# Patient Record
Sex: Male | Born: 1940 | Race: White | Hispanic: No
Health system: Southern US, Community
[De-identification: ages and names within clinical notes are randomized; demographics above are authoritative.]

---

## 2011-03-08 ENCOUNTER — Emergency Department: Payer: Self-pay | Admitting: Emergency Medicine

## 2011-03-20 ENCOUNTER — Emergency Department: Payer: Self-pay | Admitting: *Deleted

## 2011-03-21 ENCOUNTER — Emergency Department: Payer: Self-pay | Admitting: Emergency Medicine

## 2011-05-20 ENCOUNTER — Ambulatory Visit: Payer: Self-pay | Admitting: Internal Medicine

## 2011-06-02 ENCOUNTER — Inpatient Hospital Stay: Payer: Self-pay | Admitting: Internal Medicine

## 2011-06-02 LAB — COMPREHENSIVE METABOLIC PANEL
Albumin: 3 g/dL — ABNORMAL LOW (ref 3.4–5.0)
Anion Gap: 14 (ref 7–16)
BUN: 43 mg/dL — ABNORMAL HIGH (ref 7–18)
Bilirubin,Total: 0.3 mg/dL (ref 0.2–1.0)
Chloride: 109 mmol/L — ABNORMAL HIGH (ref 98–107)
Co2: 29 mmol/L (ref 21–32)
EGFR (African American): >60
Glucose: 229 mg/dL — ABNORMAL HIGH (ref 65–99)
Osmolality: 320 (ref 275–301)
Potassium: 3.6 mmol/L (ref 3.5–5.1)
SGOT(AST): 36 U/L (ref 15–37)
SGPT (ALT): 46 U/L
Sodium: 152 mmol/L — ABNORMAL HIGH (ref 136–145)
Total Protein: 8.5 g/dL — ABNORMAL HIGH (ref 6.4–8.2)

## 2011-06-02 LAB — CBC
HCT: 44.1 % (ref 40.0–52.0)
HGB: 14.4 g/dL (ref 13.0–18.0)
MCH: 30.3 pg (ref 26.0–34.0)
MCV: 93 fL (ref 80–100)
Platelet: 243 10*3/uL (ref 150–440)
RBC: 4.74 10*6/uL (ref 4.40–5.90)

## 2011-06-02 LAB — URINALYSIS, COMPLETE
Glucose,UR: NEGATIVE mg/dL (ref 0–75)
Hyaline Cast: 6
Nitrite: NEGATIVE
Ph: 5 (ref 4.5–8.0)
Protein: 100
Specific Gravity: 1.033 (ref 1.003–1.030)
WBC UR: 199 /HPF (ref 0–5)

## 2011-06-02 LAB — TROPONIN I: Troponin-I: <0.02 ng/mL

## 2011-06-03 LAB — CBC WITH DIFFERENTIAL/PLATELET
Basophil #: 0 10*3/uL (ref 0.0–0.1)
Basophil %: 0.2 %
Eosinophil %: 0.8 %
HCT: 38.7 % — ABNORMAL LOW (ref 40.0–52.0)
HGB: 12.7 g/dL — ABNORMAL LOW (ref 13.0–18.0)
Lymphocyte #: 1.2 10*3/uL (ref 1.0–3.6)
Lymphocyte %: 11 %
MCH: 30.4 pg (ref 26.0–34.0)
MCHC: 32.8 g/dL (ref 32.0–36.0)
Monocyte %: 7.6 %
Neutrophil #: 8.8 10*3/uL — ABNORMAL HIGH (ref 1.4–6.5)
Neutrophil %: 80.4 %
Platelet: 211 10*3/uL (ref 150–440)
RBC: 4.18 10*6/uL — ABNORMAL LOW (ref 4.40–5.90)
WBC: 10.9 10*3/uL — ABNORMAL HIGH (ref 3.8–10.6)

## 2011-06-03 LAB — BASIC METABOLIC PANEL
BUN: 38 mg/dL — ABNORMAL HIGH (ref 7–18)
Calcium, Total: 8.9 mg/dL (ref 8.5–10.1)
Chloride: 110 mmol/L — ABNORMAL HIGH (ref 98–107)
Chloride: 110 mmol/L — ABNORMAL HIGH (ref 98–107)
Co2: 30 mmol/L (ref 21–32)
Co2: 32 mmol/L (ref 21–32)
Creatinine: 1.06 mg/dL (ref 0.60–1.30)
EGFR (Non-African Amer.): >60
Osmolality: 306 (ref 275–301)
Potassium: 3.3 mmol/L — ABNORMAL LOW (ref 3.5–5.1)
Potassium: 4 mmol/L (ref 3.5–5.1)
Sodium: 150 mmol/L — ABNORMAL HIGH (ref 136–145)

## 2011-06-04 LAB — BASIC METABOLIC PANEL
Anion Gap: 11 (ref 7–16)
BUN: 34 mg/dL — ABNORMAL HIGH (ref 7–18)
EGFR (African American): >60
EGFR (Non-African Amer.): >60
Osmolality: 308 (ref 275–301)

## 2011-06-04 LAB — CBC WITH DIFFERENTIAL/PLATELET
Basophil #: 0 10*3/uL (ref 0.0–0.1)
Eosinophil #: 0.2 10*3/uL (ref 0.0–0.7)
HCT: 37.8 % — ABNORMAL LOW (ref 40.0–52.0)
Lymphocyte #: 1.2 10*3/uL (ref 1.0–3.6)
MCH: 30.6 pg (ref 26.0–34.0)
MCHC: 32.8 g/dL (ref 32.0–36.0)
MCV: 93 fL (ref 80–100)
Monocyte #: 0.7 10*3/uL (ref 0.0–0.7)
Neutrophil #: 12 10*3/uL — ABNORMAL HIGH (ref 1.4–6.5)
Neutrophil %: 85.1 %
RDW: 13.4 % (ref 11.5–14.5)

## 2011-06-04 LAB — APTT: Activated PTT: 32.9 secs (ref 23.6–35.9)

## 2011-06-05 LAB — PROTIME-INR
INR: 1
Prothrombin Time: 13.5 secs (ref 11.5–14.7)

## 2011-06-05 LAB — BASIC METABOLIC PANEL
Anion Gap: 10 (ref 7–16)
BUN: 26 mg/dL — ABNORMAL HIGH (ref 7–18)
Co2: 31 mmol/L (ref 21–32)
Creatinine: 0.75 mg/dL (ref 0.60–1.30)
EGFR (Non-African Amer.): >60
Glucose: 120 mg/dL — ABNORMAL HIGH (ref 65–99)
Osmolality: 297 (ref 275–301)
Potassium: 3.1 mmol/L — ABNORMAL LOW (ref 3.5–5.1)
Sodium: 146 mmol/L — ABNORMAL HIGH (ref 136–145)

## 2011-06-05 LAB — APTT
Activated PTT: 56.5 secs — ABNORMAL HIGH (ref 23.6–35.9)
Activated PTT: 46.6 secs — ABNORMAL HIGH (ref 23.6–35.9)
Activated PTT: 71 secs — ABNORMAL HIGH (ref 23.6–35.9)

## 2011-06-05 LAB — CBC WITH DIFFERENTIAL/PLATELET
Basophil %: 0.4 %
Eosinophil #: 0.3 10*3/uL (ref 0.0–0.7)
Eosinophil %: 3.4 %
HGB: 11.1 g/dL — ABNORMAL LOW (ref 13.0–18.0)
Lymphocyte #: 1.2 10*3/uL (ref 1.0–3.6)
MCH: 30.4 pg (ref 26.0–34.0)
MCV: 94 fL (ref 80–100)
Monocyte #: 0.6 10*3/uL (ref 0.0–0.7)
Monocyte %: 5.8 %
Neutrophil #: 7.9 10*3/uL — ABNORMAL HIGH (ref 1.4–6.5)
RBC: 3.66 10*6/uL — ABNORMAL LOW (ref 4.40–5.90)

## 2011-06-05 LAB — MAGNESIUM: Magnesium: 2.1 mg/dL

## 2011-06-05 LAB — POTASSIUM: Potassium: 3.5 mmol/L (ref 3.5–5.1)

## 2011-06-05 LAB — URINE CULTURE

## 2011-06-06 LAB — BASIC METABOLIC PANEL
Anion Gap: 10 (ref 7–16)
BUN: 22 mg/dL — ABNORMAL HIGH (ref 7–18)
Calcium, Total: 8.8 mg/dL (ref 8.5–10.1)
Chloride: 104 mmol/L (ref 98–107)
EGFR (African American): >60
EGFR (Non-African Amer.): >60
Glucose: 117 mg/dL — ABNORMAL HIGH (ref 65–99)
Osmolality: 293 (ref 275–301)

## 2011-06-06 LAB — APTT: Activated PTT: 78.8 secs — ABNORMAL HIGH (ref 23.6–35.9)

## 2011-06-08 LAB — CULTURE, BLOOD (SINGLE)

## 2011-06-20 ENCOUNTER — Ambulatory Visit: Payer: Self-pay | Admitting: Internal Medicine

## 2011-06-20 DEATH — deceased

## 2012-11-15 IMAGING — CR DG CHEST 2V
1 series · 4 of 4 positions shown · non-contrast
Comparison: none

REASON FOR EXAM: cut off base of right and tip of left lung   please redo
 AMS
COMMENTS:

[Series 1: view not recorded · 0.17mm/px · 4 of 4 slices shown]
[im 1/4]
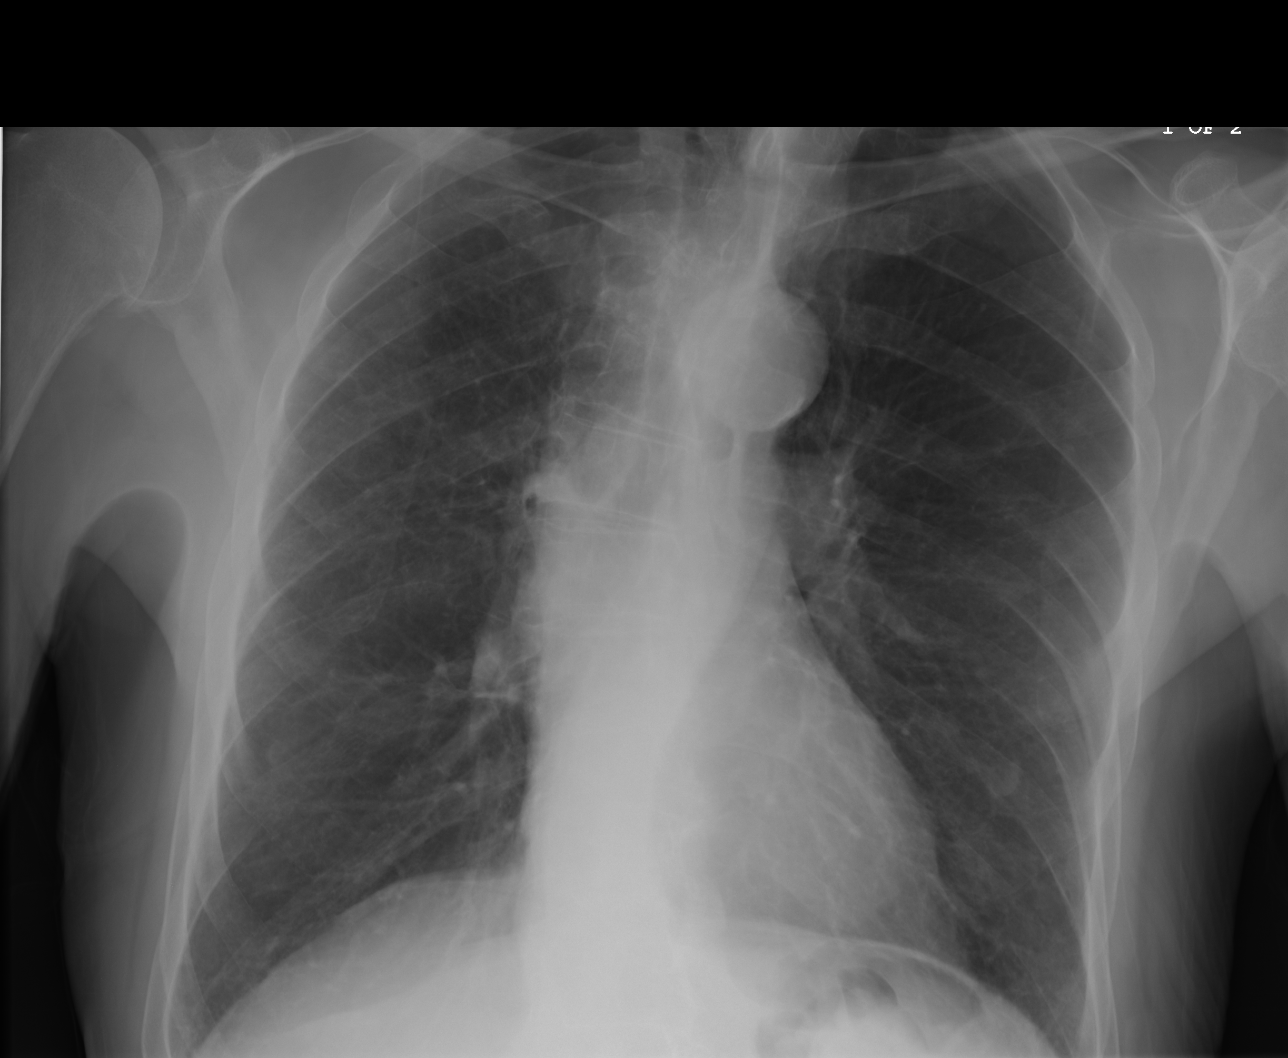
[im 2/4]
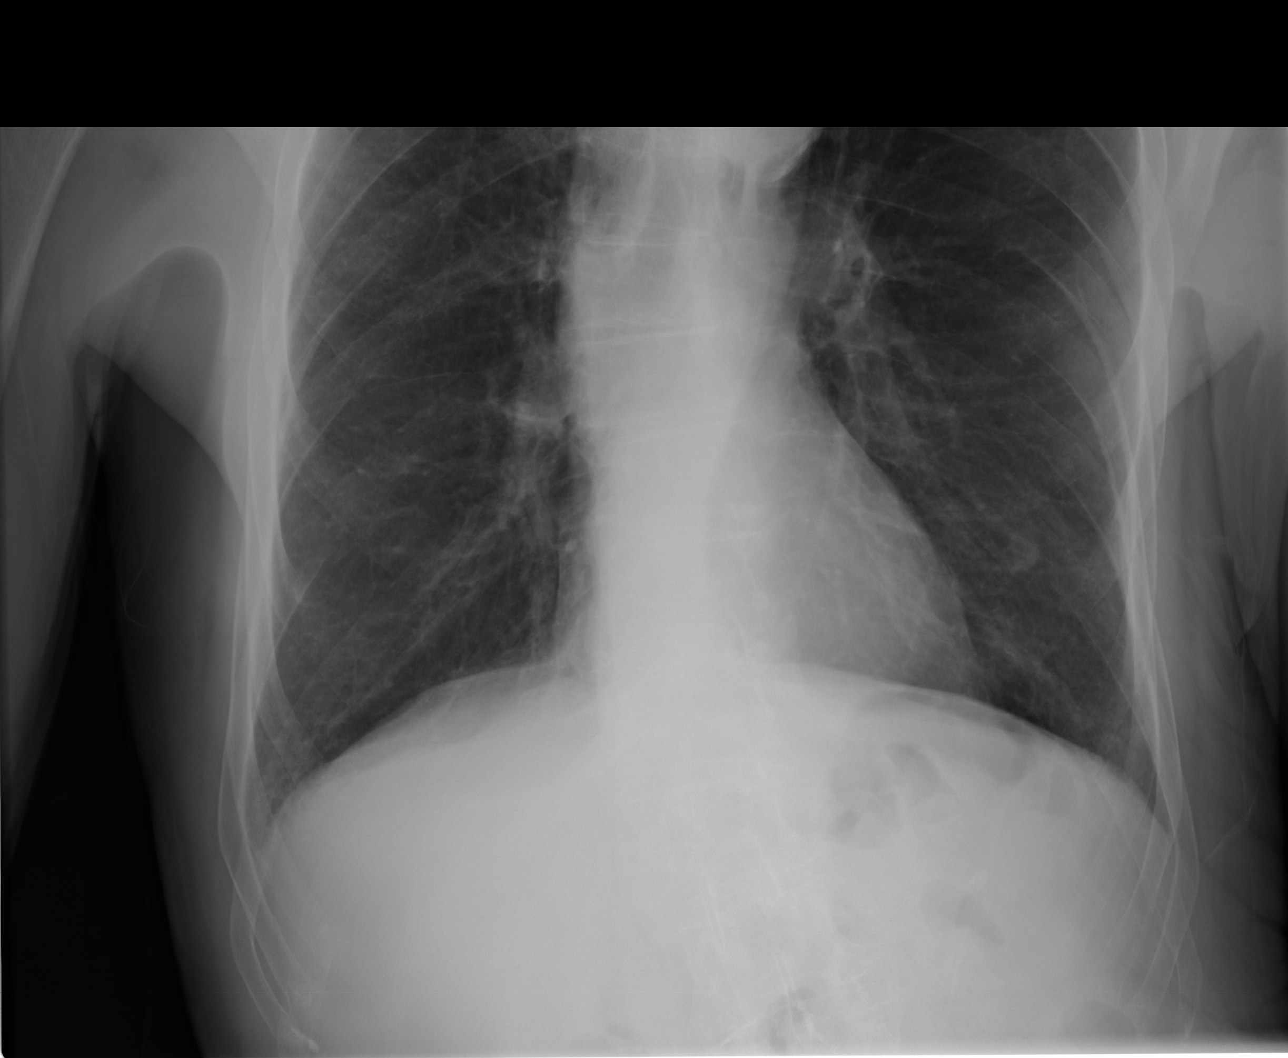
[im 3/4]
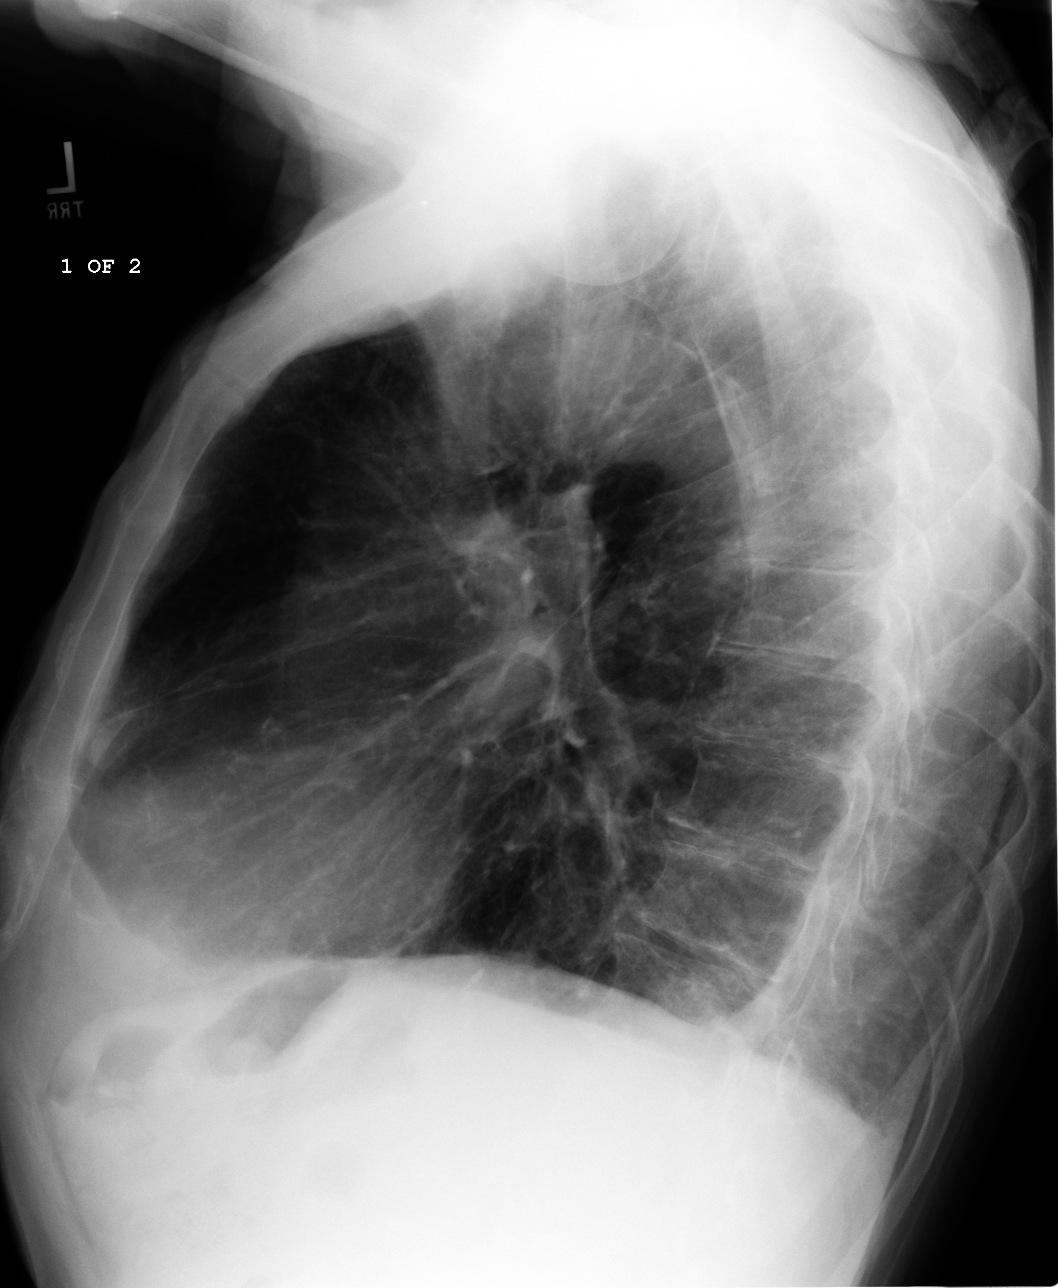
[im 4/4]
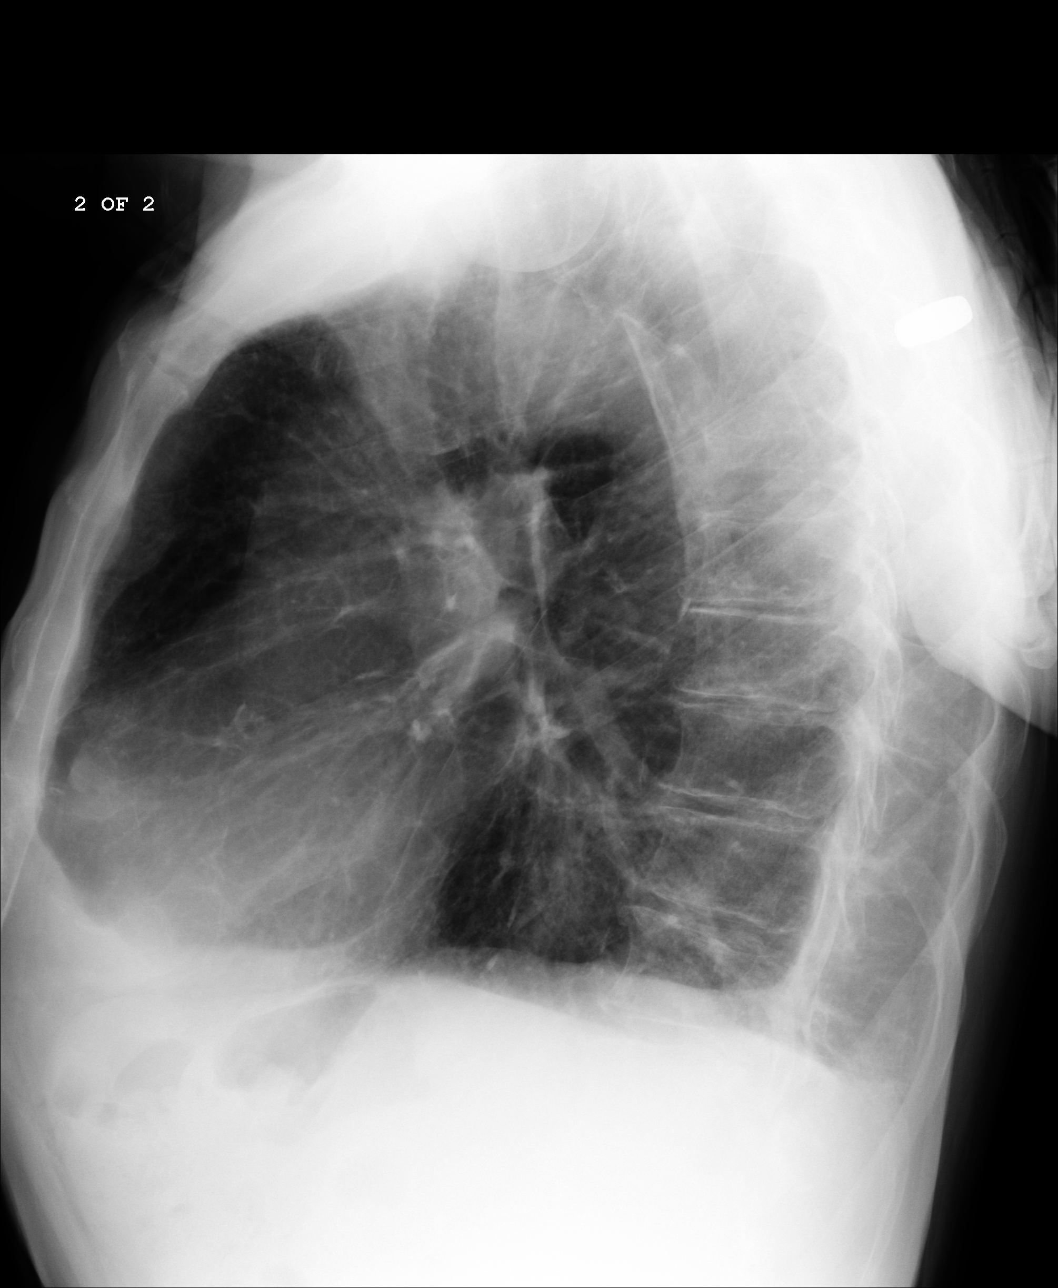

[4 of 4 positions shown; findings below may reference images not displayed]

PROCEDURE:     DXR - DXR CHEST PA (OR AP) AND LATERAL  - March 21, 2011  [DATE]

RESULT:     Comparison is made to a prior study dated same date earlier time.

There is flattening of the hemidiaphragms and increased AP diameter of the
chest. The lungs are hyperinflated. No focal regions of consolidation or
focal infiltrates appreciated. The cardiac silhouette and visualized bony
skeleton are unremarkable.
IMPRESSION: COPD without evidence of acute cardiopulmonary disease.

## 2012-11-15 IMAGING — CR DG CHEST 1V PORT
1 series · 1 of 1 positions shown · non-contrast
Comparison: none

REASON FOR EXAM: Chest Pain
COMMENTS:

PROCEDURE:     DXR - DXR PORTABLE CHEST SINGLE VIEW  - March 21, 2011  [DATE]
RESULT:     The lungs are hyperinflated. No focal regions of consolidation
or focal infiltrates appreciated. The cardiac silhouette is within normal
limits. The visualized bony skeleton is unremarkable.

[view not recorded]
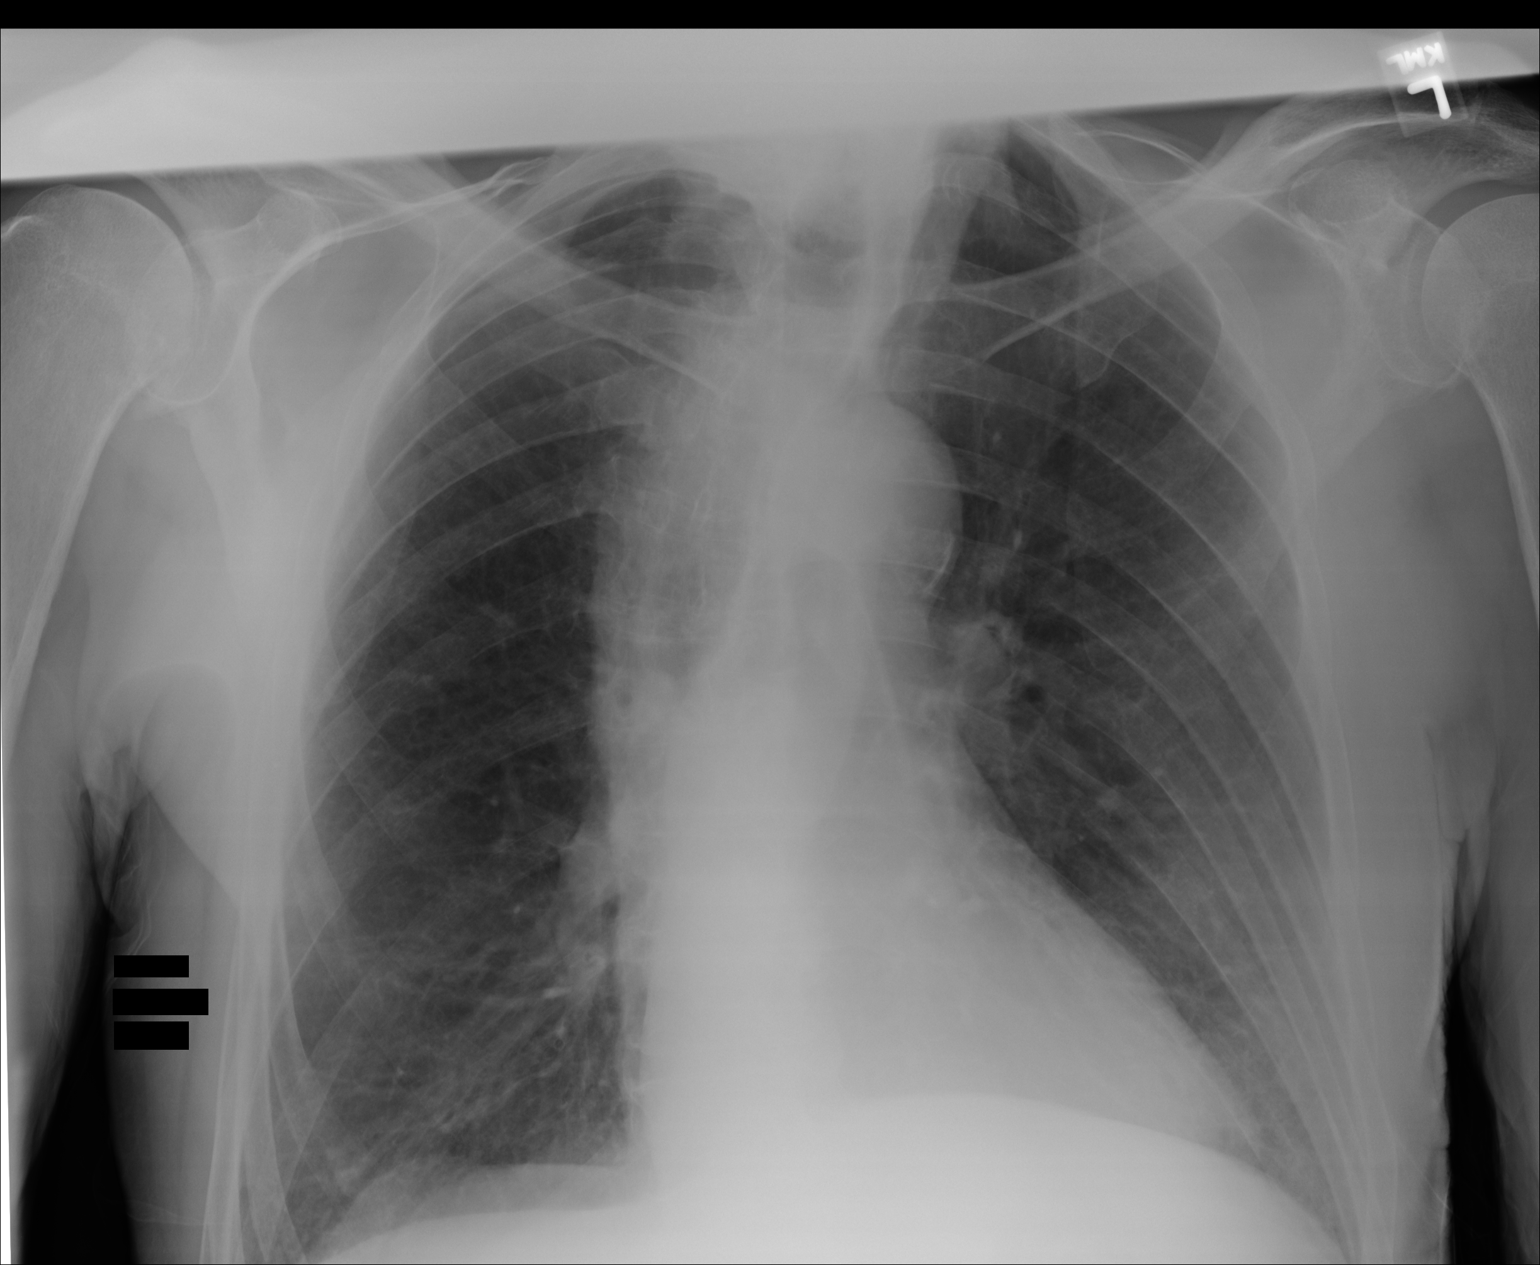

[1 of 1 positions shown; findings below may reference images not displayed]

IMPRESSION: COPD without evidence of acute cardiopulmonary disease.

## 2013-01-27 IMAGING — CR DG CHEST 2V
1 series · 4 of 4 positions shown · non-contrast
Comparison: none

REASON FOR EXAM: fever
COMMENTS:   May transport without cardiac monitor

[Series 1: ap · 0.17mm/px · 4 of 4 slices shown]
[im 1/4]
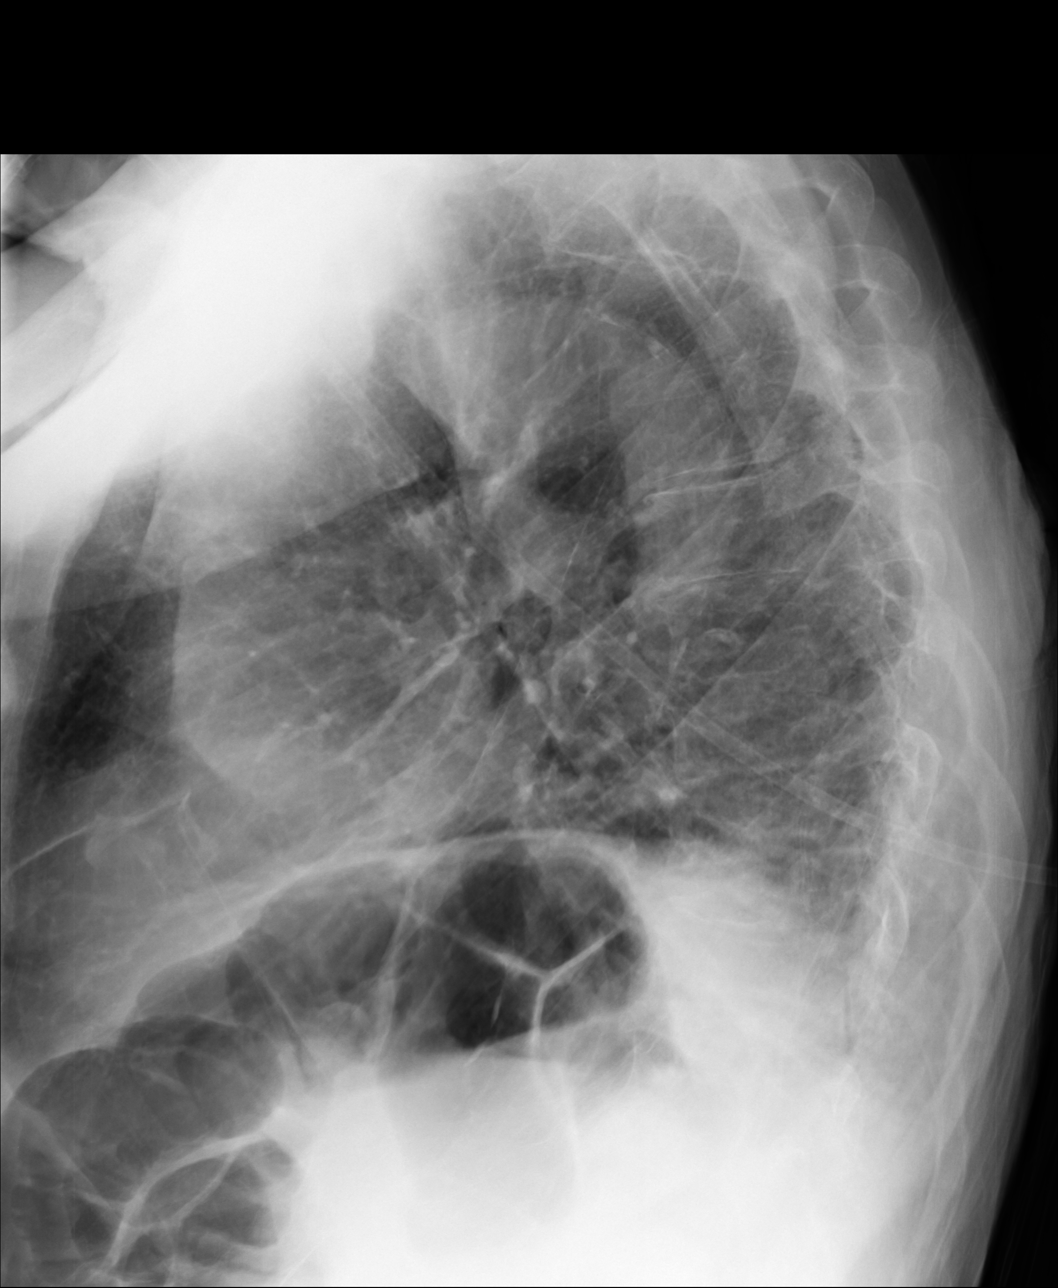
[im 2/4]
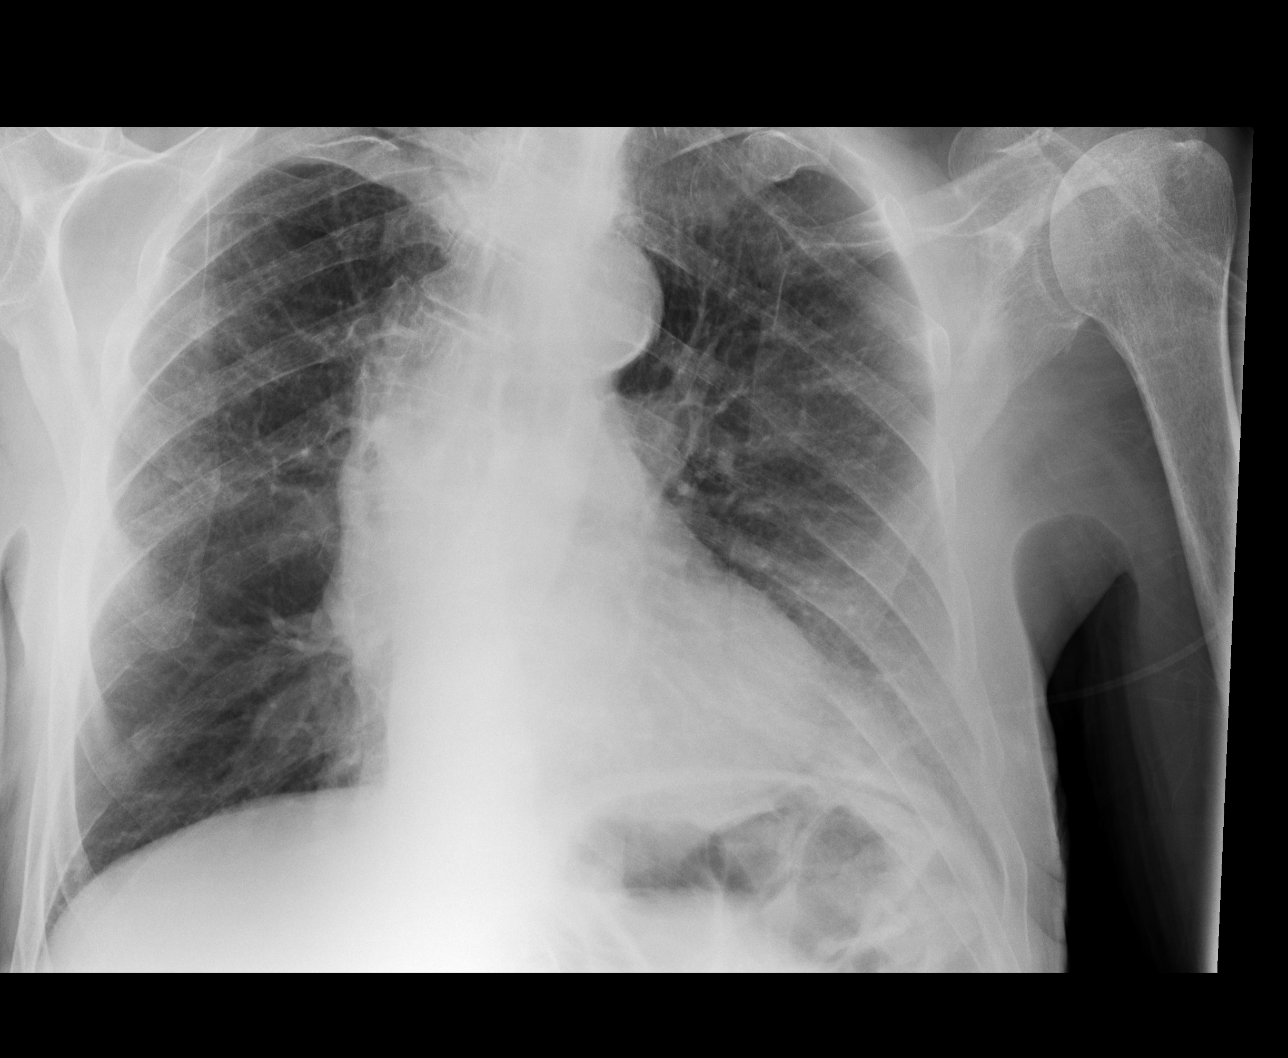
[im 3/4]
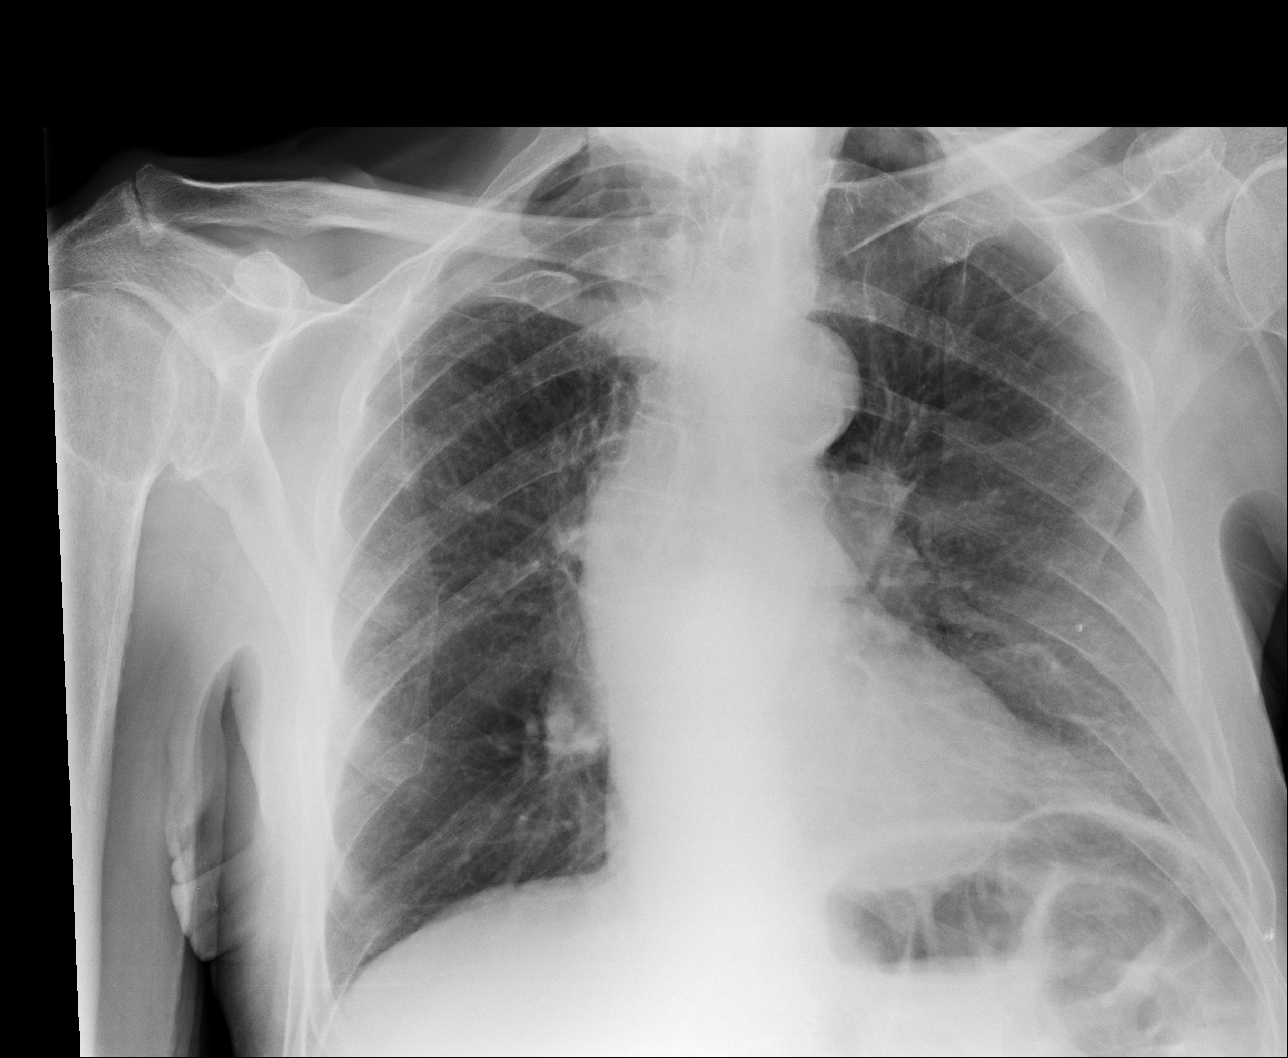
[im 4/4]
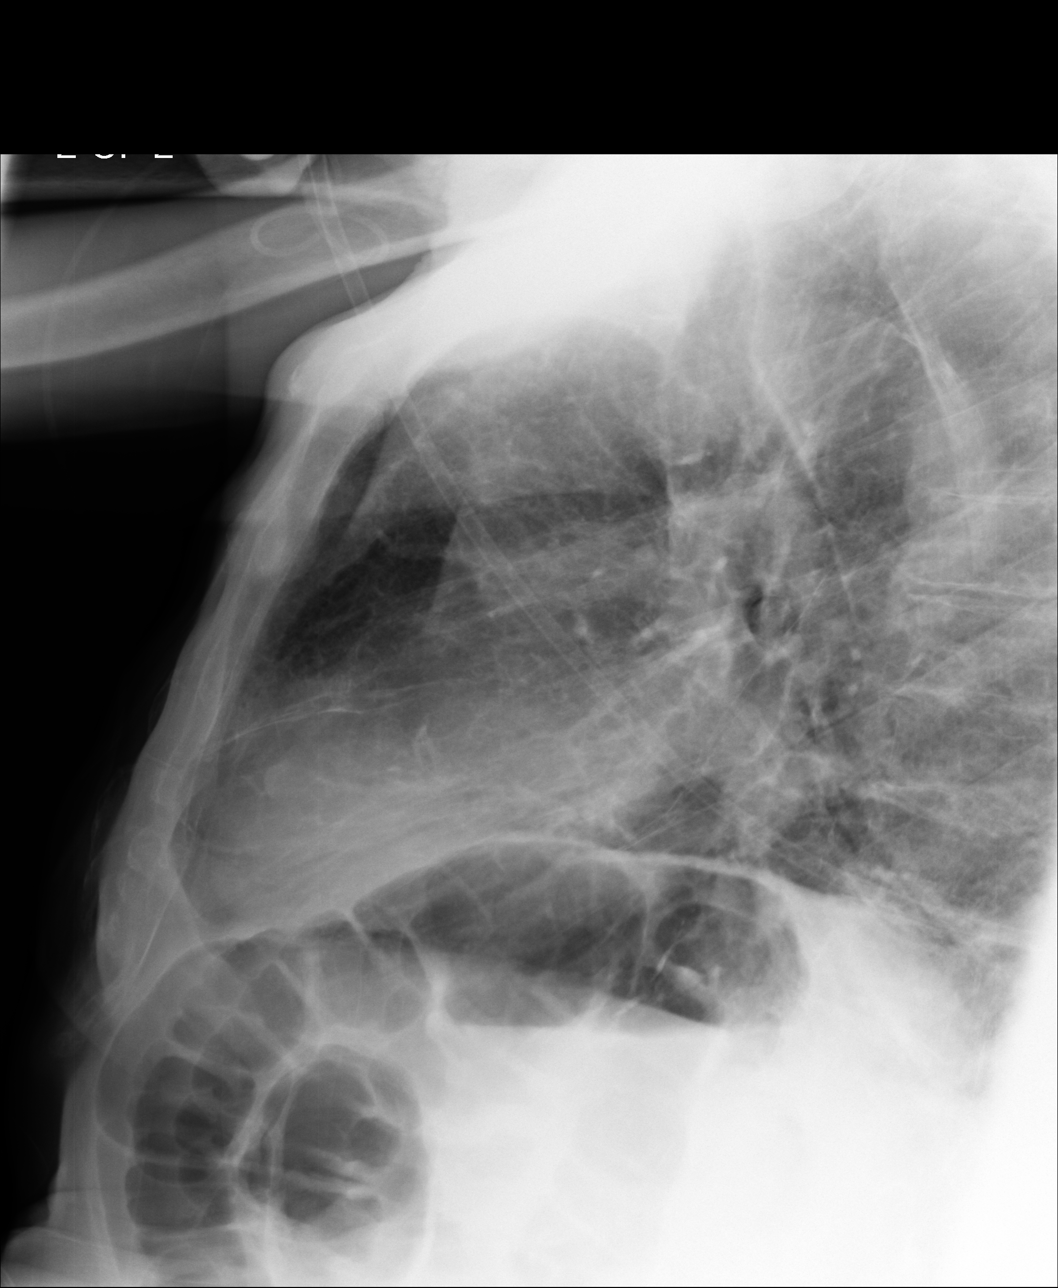

[4 of 4 positions shown; findings below may reference images not displayed]

PROCEDURE:     DXR - DXR CHEST PA (OR AP) AND LATERAL  - June 02, 2011  [DATE]

RESULT:     Comparison is made to the study dated 21 March, 2011.

There is increased density at the left lung base compared to the previous
study concerning for left lung base atelectasis versus developing
infiltrate. Trace left pleural effusion appears present. The cardiac
silhouette is normal. Curvilinear radiopacity projects just below the right
clavicle over the right upper lobe and may represent artifact. There is no
edema or pneumothorax. Atherosclerotic calcification is present within the
aortic arch. There is a mild thoracic scoliosis concave to the left.
IMPRESSION: 1. Left lung base developing infiltrate versus atelectasis with a trace left
pleural effusion. Emphysematous lung disease present.

## 2013-01-29 IMAGING — CT CT CHEST-ABD-PELV W/ CM
1 of 3 series · 11 of 32 positions shown, 17 images · non-contrast
Comparison: None

REASON FOR EXAM: (1) pneumonia; (2) pain on palpation, elevated WBC
count, worsening
COMMENTS:

PROCEDURE:     CT  - CT CHEST ABDOMEN AND PELVIS W  - June 04, 2011  [DATE]
RESULT:     CT CHEST, ABDOMEN, AND PELVIS
HISTORY: Pneumonia
TECHNIQUE: Multiple axial images obtained from the thoracic inlet to the
pubic symphysis, without p.o. contrast and with 100 ml of Fsovue-SCS
intravenous contrast.

[Series 2: soft tissue · axial · 0.84mm/px · z∈[-606,-11]mm · 11 of 143 slices shown, 17 images]
[im 12/143  soft-tissue]
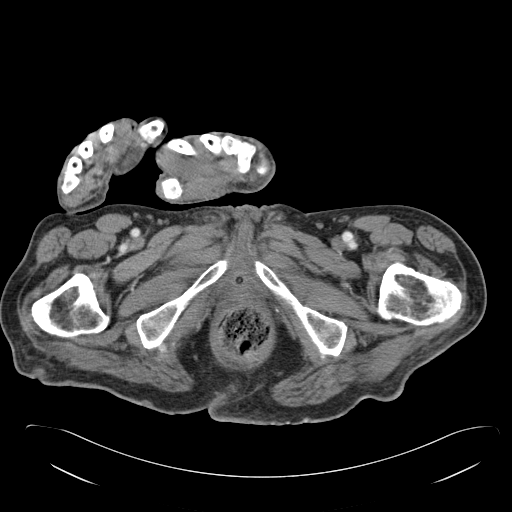
[im 12/143  bone]
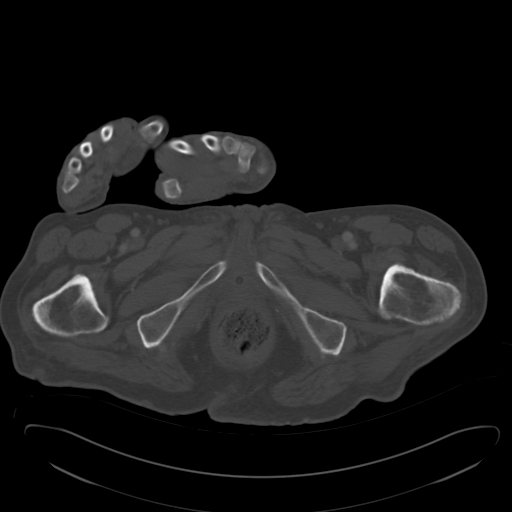
[im 24/143  soft-tissue]
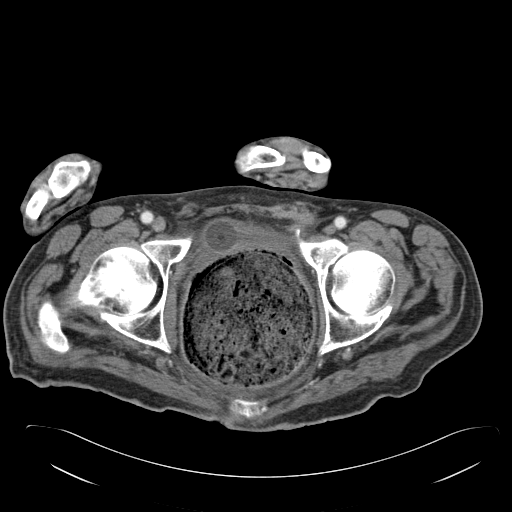
[im 36/143  soft-tissue]
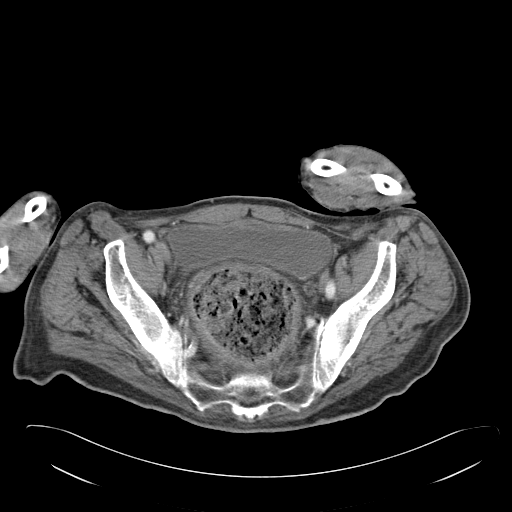
[im 48/143  soft-tissue]
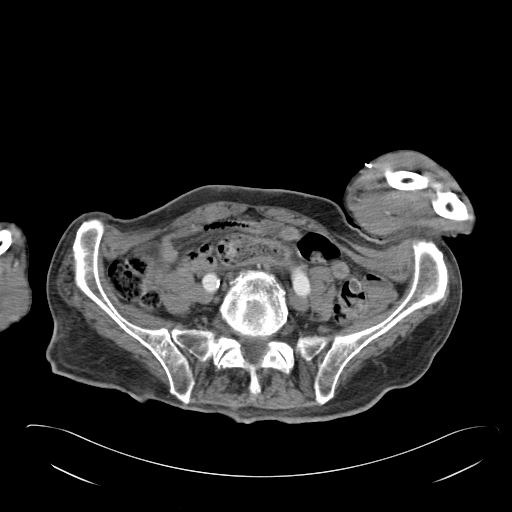
[im 60/143  soft-tissue]
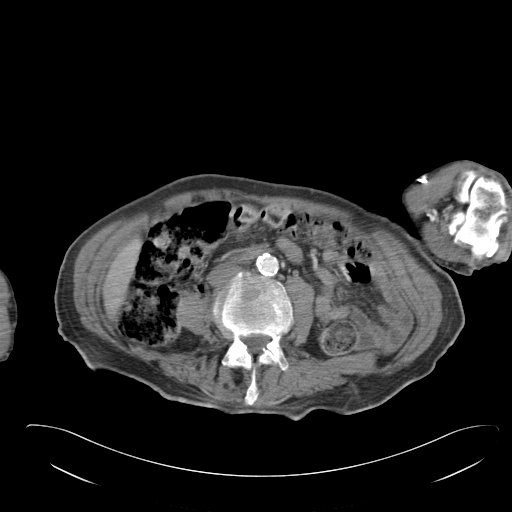
[im 72/143  soft-tissue]
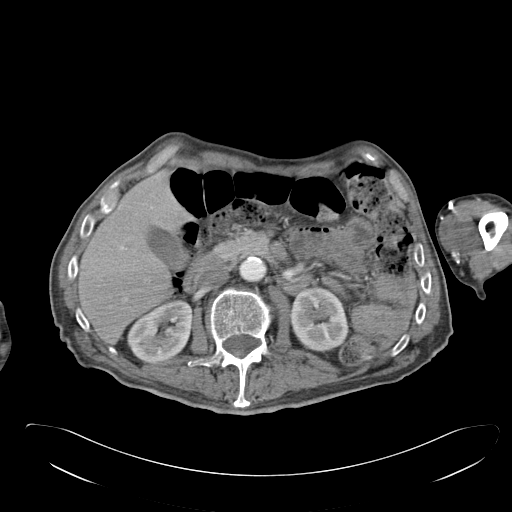
[im 83/143  soft-tissue]
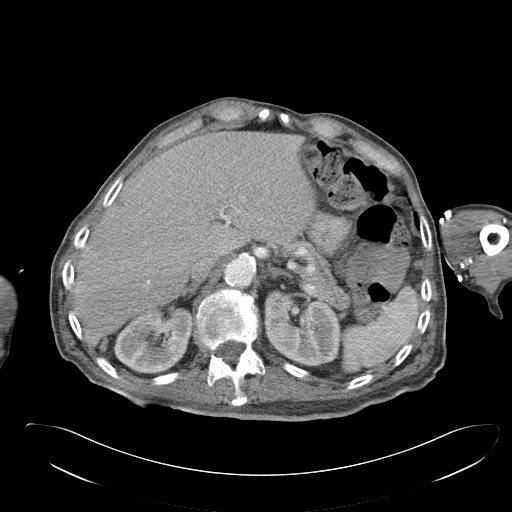
[im 95/143  soft-tissue]
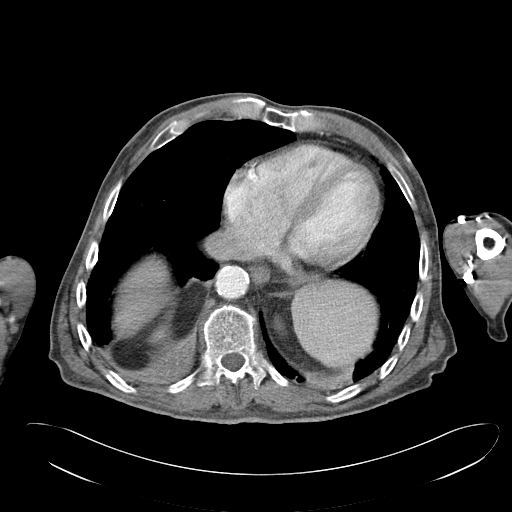
[im 95/143  lung]
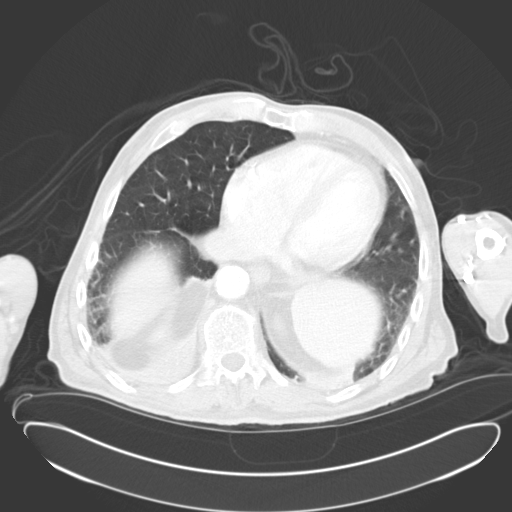
[im 107/143  soft-tissue]
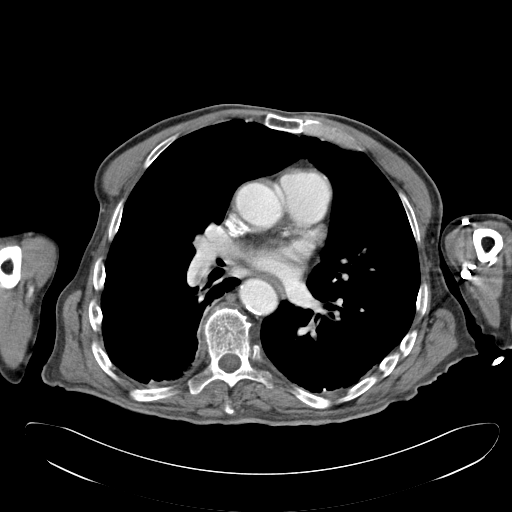
[im 107/143  lung]
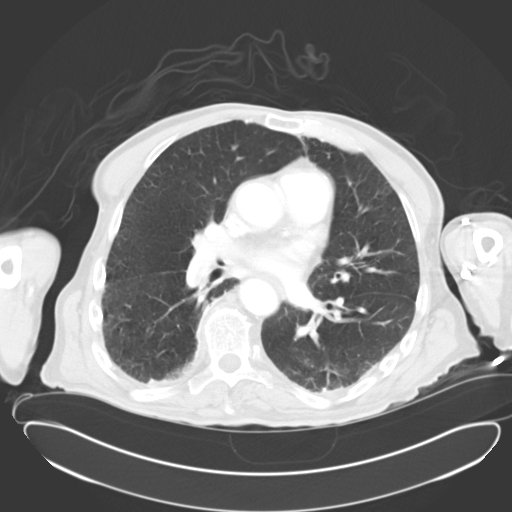
[im 107/143  bone]
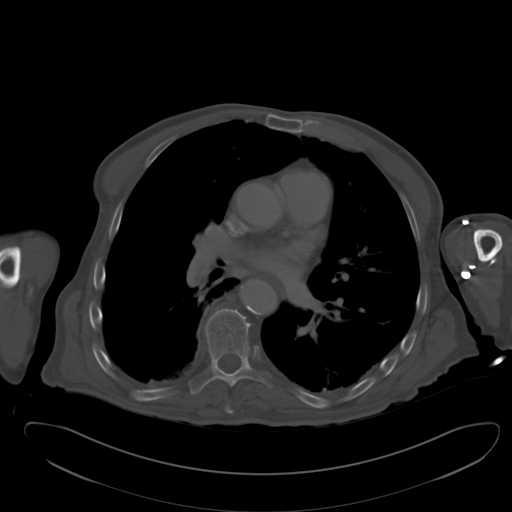
[im 119/143  soft-tissue]
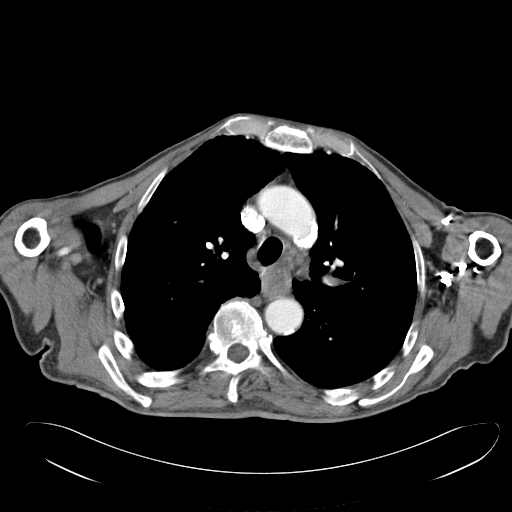
[im 119/143  lung]
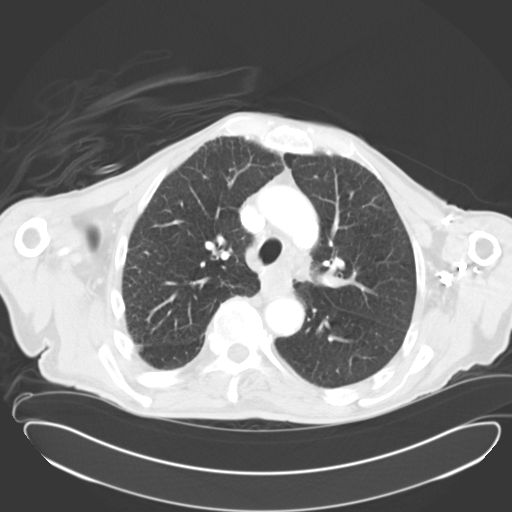
[im 131/143  soft-tissue]
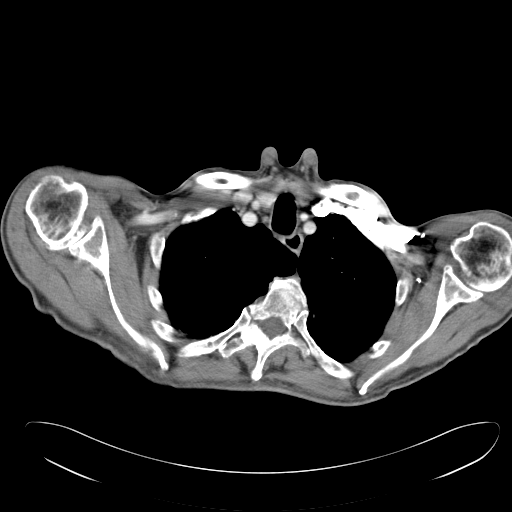
[im 131/143  lung]
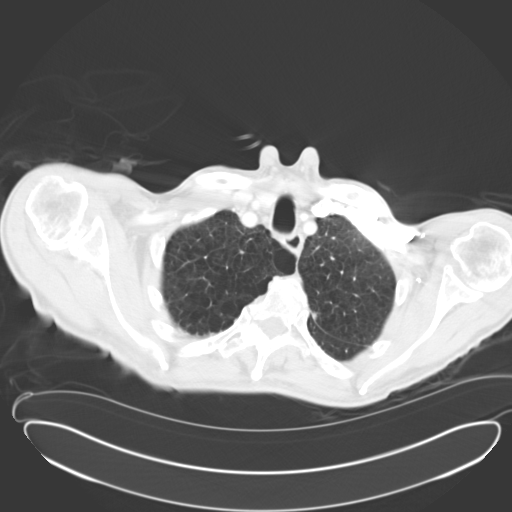

[11 of 32 positions shown; findings below may reference images not displayed]

FINDINGS: CHEST:

There bilateral emphysematous changes. There is a pulmonary embolus within
the right lower lobe and right upper lobe pulmonary artery segments. There
is a small pulmonary embolus in the left main pulmonary artery. There is
right lower lobe consolidation which may resent atelectasis versus
infiltrate. There is a small area of left lower lobe airspace disease which
may represent atelectasis versus infiltrate. There is no focal mass. There
is no  pleural effusion or pneumothorax.

The heart size is normal. There is no pericardial effusion. There is
coronary artery atherosclerosis.

There are no pathologically enlarged mediastinal, hilar, or axillary lymph
nodes.

The osseous structures demonstrate no focal abnormality.

ABDOMEN/PELVIS:

The liver demonstrates no focal abnormality. There is no intrahepatic or
extrahepatic biliary ductal dilatation. The gallbladder is unremarkable. The
spleen demonstrates no focal abnormality. The kidneys and adrenal glands are
normal. There multiple subcentimeter cystic areas within the pancreatic body
and pancreatic tail which are of uncertain significance.

The rectum is severely dilated measuring 12 cm in diameter with a large
amount stool present. There is a small fat-containing umbilical hernia. In
There is no pneumoperitoneum, pneumatosis, or portal venous gas. There is no
abdominal or pelvic free fluid. There is no lymphadenopathy.

The abdominal aorta is normal in caliber with atherosclerosis.

There is a chronic L5 vertebral body compression fracture.
IMPRESSION: 1. Bilateral pulmonary emboli.

2. The rectum is severely dilated measuring 12 cm in diameter with a large
amount stool present.

3. Right lower lobe airspace disease which may represent atelectasis versus
infiltrate. Small area of airspace disease in the left lower liver which may
represent atelectasis versus infiltrate.

4. There multiple subcentimeter cystic areas within the pancreatic body and
pancreatic tail which are of uncertain significance.

These findings were communicated to nurse Agnes with read back of results on
06/04/2011 at 8333 hours .

## 2014-09-10 NOTE — Discharge Summary (Signed)
PATIENT NAME:  Johnny Carrillo, Johnny Carrillo MR#:  [id number] DATE OF BIRTH:  Mar 26, 1941  DATE OF ADMISSION:  06/02/2011 DATE OF DISCHARGE:  06/06/2011  ADMITTING DIAGNOSIS: Acute respiratory failure.   DISCHARGE DIAGNOSES:  1. Acute respiratory failure secondary to aspiration pneumonia as well as bilateral pulmonary embolism.  2. Systemic inflammatory response reaction due to pneumonia.  3. Pressure wound.  4. Hypernatremia. 5. Dehydration.  6. Severe protein calorie malnutrition with prealbumin level of 9. 7. Dementia.  8. Constipation. 9. Rectal impaction, resolved.  10. Urinary tract infection, Enterococcus faecalis.   DISCHARGE CONDITION: Guarded.   DISCHARGE MEDICATIONS: The patient is to resume his outpatient medications which are:  1. Tylenol 650 mg p.o. every four hours as needed.  2. Memantine 5 mg p.o. twice daily.  3. Venlafaxine 75 mg p.o. twice daily.  4. Amlodipine 5 mg p.o. daily.  5. Loratadine 10 mg p.o. daily.  6. Aspirin 81 mg p.o. daily.  7. Flora-Q capsule once daily.  8. Multivitamin with minerals once daily.  9. Tamsulosin 0.4 mg p.o. daily.  10. Risperidone 1 mg p.o. at bedtime.  11. Donepezil 10 mg p.o. at bedtime.  12. Simvastatin 10 mg p.o. daily at bedtime.  13. Tramadol 50 mg every six hours as needed.  14. Lorazepam 0.5 mg twice daily as needed.  15. Ketoconazole  2% topical shampoo apply to scalp topically twice weekly for dandruff.  16. Ammonium lactate 12% topical lotion, apply to bilateral lower extremities twice a day.  17. Med Pass supplement 240 mL 3 times daily between meals.   ADDITIONAL MEDICATIONS:  1. Xarelto 15 mg p.o. twice daily for 21 days, then 20 mg p.o. daily indefinitely.  2. Augmentin 600 mg p.o. twice daily for seven more days.   DO NOT TAKE: The patient is not to take HCTZ or any other diuretics because he is prone to dehydration unless recommended by primary care physician.   HOME OXYGEN: 3 liters of oxygen through nasal cannula,  wean off oxygen as tolerated keeping pulse oximetry at around 92% and above.  DIET: Pureed with thickened liquids, honey consistency liquids. Meds crushed in puree as able. Feeding at all meals. Strict aspiration precautions.   ACTIVITY LIMITATIONS: As tolerated.   REFERRAL: Hospice follow-up at facility.  FOLLOW-UP: Follow-up appointment with Dr. Larena Sox in two days after discharge.  CONSULTANTS:  1. Ms. Jerilynn Som, speech therapist  2. Mr. Laurette Schimke, Palliative Care  3. Care Management  4. Ms. Glee Arvin, RN, Wound Care  5. Dr. Harriett Sine Phifer, Palliative Care    RADIOLOGIC STUDIES:  1. Chest x-ray, PA and lateral, 06/02/2011, left lung base developing infiltrate versus atelectasis with a trace of pleural effusion. Emphysematous lung disease present.  2. CT of chest, abdomen, and pelvis with contrast on 06/04/2011 showing bilateral pulmonary emboli. Rectum is severely dilated measuring 12 cm in diameter with a large amount of stool present. Right lower lobe airspace disease which may represent atelectasis versus infiltrate. Small area of airspace disease in the left lower lobe which may represent atelectasis versus infiltrate. There are multiple subcentimeter cystic areas within the pancreatic body and pancreatic tail which are of uncertain significance.   REASON FOR ADMISSION: The patient is a 74 year old Caucasian male with past medical history significant for history of dementia who presented to the hospital with complaints that he had problems with shortness of breath, was increasingly lethargic, and has had high fevers. Please refer to Dr. Urban Gibson admission note on 06/02/2011.  PHYSICAL EXAMINATION: On arrival  to the Emergency Room, the patient was noted to have T-max of 101 in nursing home. Here in the Emergency Room his heart rate was 120, respiration rate 34, blood pressure 150/94, saturation 90% on oxygen therapy. Physical exam revealed diminished breath sounds  bilaterally, otherwise unremarkable exam. He was poorly arousable.   LABORATORY DATA: Elevated glucose of 229, BUN elevated to 43, sodium 152, otherwise unremarkable BMP. The patient's liver enzymes showed albumin level of 3.0. Troponin was less than 0.02. CBC was within normal limits. Blood cultures x2 did not show any growth. Urine culture with indwelling catheter showed Enterococcus faecalis more than 100,000 colony forming units sensitive to tetracycline, nitrofurantoin, as well as ampicillin; resistant to ciprofloxacin as well as levofloxacin and sensitive to linezolid and also Candida albicans. Urinalysis showed amber cloudy urine, negative for glucose, bilirubin, trace ketones, specific gravity 1.033, pH 5.0, negative for blood, 100 mg/dL protein, negative for nitrites, 3+ leukocyte esterase, 61 red blood cells, 199 white blood cells, trace bacteria noted. ABGs were done on 06/03/2011 and showed pH of 7.52, pCO2 37, pO2 78, saturation 96.7% on 28% FiO2 per nasal cannula. The patient's chest x-ray revealed pneumonia.  HOSPITAL COURSE: The patient was admitted to the hospital with diagnosis of systemic inflammatory stress reaction due to pneumonia as well as urinary tract infection. He was started on broad-spectrum antibiotic therapy. Because of his hypoxia as well as some abdominal discomfort he was exhibiting on physical exam, the patient underwent CT scanning of his chest, abdomen, as well as pelvis on 06/04/2011. It showed bilateral pulmonary embolus as well as bilateral pneumonia and dilated rectum with large amount of stool. 1. For his pulmonary embolism, initially was started on heparin as well as Coumadin bridge, however, later on he was transitioned to Xarelto as easiest mean for this therapy. It was felt that the patient would benefit from this therapy indefinitely for pulmonary embolism.  2. In regards to pneumonia, the patient's pneumonia was felt to be very likely aspiration pneumonitis. The  patient was treated initially on broad-spectrum antibiotics, however, later on he was transitioned to Augmentin orally.  3. Urinary tract infection, Enterococcus faecalis, is sensitive to ampicillin so Augmentin worked for this patient quite well. The patient is to continue antibiotic therapy for seven more days to complete course.  4. The patient was also evaluated by wound care specialist, Glee Arvinonna Partin, RN. Lupita LeashDonna felt that patient had a few problems. She noted deep tissue injury in distal left great toe measuring 0.5 cm x 0.5 cm x 0 cm. Skin was intact initial. No drainage was noted. Unstageable pressure ulcer of left heel was measured at 0.7 cm x 1 cm x 0.1 cm. Small amount of serous drainage was noted. 50% intact moist eschar was noted as well as slight erythema to periwound but no odor. Coccyx with stage II pressure ulcer was measuring at 1.5 cm x 2.5 cm x 0.2 cm in depth.  Small amount of serosanguineous drainage, no odor, was noted, wound base with 50% moist yellow slough. Surrounding ecchymosis indicative of suspected deep tissue injury. She stated that the patient's left great toe suspected DTI would not need dressing. Pressure relief according to Lupita LeashDonna would be the most appropriate treatment. In regards to left heel, she felt that it would be best to keep this wound dry until the [facility] discharge and then the patient should resume protocol at that time. Recommended to clean with normal saline and Saf-Clens spray. Apply dry  gauze dressing and secure with conform wrap and paper tape. Change daily. In regards to coccyx, she recommended to clean with normal saline as well as Saf-Clens spray. Apply 4 x 6 or 6 x 8 bordered foam dressing to entire sacral area. Change dressing every other day or more frequently if needed for soiling. Frequent repositioning as well as turning side to side to keep pressure off coccyx was recommended. She also recommended to continue to follow wound  progress until the patient is discharged and call Wound Center at telephone number [phone] with any questions or follow-up appointments.  5. In regards to hypernatremia which was noted on admission, it was felt that this was severe dehydration related. The patient was started on IV fluids and D5 water. He received fluids at high rate at 200 mL and hour and his sodium level improved by the day of discharge. On 06/06/2011, the patient's sodium level was 145. It is recommended to follow the patient's sodium levels as outpatient and make decisions about initiation of IV fluid therapy if this is determined the best approach for this patient. It was also felt that the patient had severe protein calorie malnutrition. It was felt that this possibly at least in part was related to his dementia and consultation with Palliative Care was obtained. Palliative Care physician as well as nurse practitioner discussed the case with the patient's family and they decided that Hospice follow-up in facility would be appropriate so the patient will be discharged with Hospice care in the facility.  6. In regards to rectal impaction/constipation, the patient was given medications to relieve constipation. It is recommended to follow the patient's bowel movements and make sure that he is being cleaned at least with enemas if needed. He had last bowel movement on 06/06/2011 which was large brown amount of thick pasty stool.   The patient is being discharged in stable, however, guarded condition with the above-mentioned medications and follow-up. His vital signs on the day of discharge revealed temperature 98.2, pulse 74, respiration rate 18, blood pressure 120/66, saturation 97 to 98% on 3 liters of oxygen through nasal cannula at rest.   TIME SPENT: 40 minutes.  ____________________________ Katharina Caper, MD rv:drc D: 06/06/2011 14:13:37 ET T: 06/06/2011 14:51:03 ET JOB#: [id number]  cc: Katharina Caper, MD, <Dictator> Glenetta Borg, MD Reeshemah Nazaryan MD ELECTRONICALLY SIGNED 06/16/2011 7:49

## 2014-09-10 NOTE — H&P (Signed)
PATIENT NAME:  Johnny Carrillo, Yaakov MR#:  [id number] DATE OF BIRTH:  06-21-1940  DATE OF ADMISSION:  06/02/2011  PRIMARY CARE PHYSICIAN:  Dr. Benito MccreedyMaria Sevilla   [facility]. The patient cannot provide any history at this time. The patient is essentially nonverbal. History was mainly obtained from the ER physician, the records, and the daughter.   HISTORY OF PRESENT ILLNESS:  74 year old male with advanced Alzheimer's dementia. 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 [facility] was [facility] for about 7-10 days.  He also has hypertension and hyperlipidemia. He has urinary retention. He has an indwelling Foley catheter. He has oropharyngeal dysphagia and he is on a dysphagia I diet with nectar thickened liquids at [facility]. [facility]. [facility] being [facility] and was still not improving. He was receiving Zyvox only for pneumonia and [facility], 1 tablet twice a day. It was started on 05/30/2011. The patient was getting increasingly lethargic as per the daughter. He spiked a [facility] of 101.3 [facility]. His chest was congested. He was coughing. At baseline as per the daughter the patient is bedbound. He has contractures. He keeps his neck to the right side. He is minimally verbal. He only says yes or no, but today has been more confused and lethargic as per the daughter. No complaint of nausea or vomiting. The patient would not provide any history at all. In the Emergency Room he was noted to have cloudy urine with 3+ leukocyte esterase and WBC 199 per high-power field. He was found to have a normal white count of 8.9. He was found to be  hypernatremic and dehydrated with a sodium of 152.  His chest x-ray today shows developing lung base infiltrate with trace pleural effusion. He is being admitted for failure  of outpatient antibiotics. His blood cultures have been sent. He is being admitted for hypernatremic dehydration, urinary tract infection, complicated, and pneumonia.  REVIEW OF SYSTEMS: Not possible as the patient cannot provide any history at this time.   PAST MEDICAL HISTORY:  1. Alzheimer's dementia with mood disorder. He was [facility]. 2. Hypertension. 3. Hyperlipidemia. 4. Urinary retention. He has a Foley catheter indwelling.  5. Joint contractures. 6. Oropharyngeal dysphagia. He is on a dysphagia I diet with nectar thickened liquids.   PAST SURGICAL HISTORY: As per the daughter, he had some knee surgery.   ALLERGIES TO MEDICATIONS: None.   HOME MEDICATIONS: As per the nursing home: 1. Duo-Nebs 3 times a day for seven days.  2. Amlodipine 5 mg daily.  3. Ammonium lactate topical solution to lower extremities topically twice a day for two weeks for xerosis.  4. Aspirin 81 mg daily. 5. Donepezil 10 mg at bedtime.  6. Flora-Q probiotic. 7. Hydrochlorothiazide 12.5 mg daily. 8. Ketoconazole topical shampoo. 9. Linezolid 600 mg b.i.d. was started on 05/30/2011. 10. Loratadine 10 mg daily.  11. Lorazepam 0.5 mg b.i.d. as needed. 12. Memantine 5 mg b.i.d. 13. Multivitamin. 14. Risperidone 1 mg at bedtime. 15. Simvastatin 20 mg at bedtime. 16. Tamsulosin 0.4 mg daily. 17. Tramadol 50 mg q. 6 hours p.r.n. 18. Tylenol 650 mg q. 4 p.r.n.  19. Venlafaxine 75 mg b.i.d.   SOCIAL HISTORY: [facility]. Prior  to that he was living with his daughter. No smoking or alcohol use.   FAMILY HISTORY: No significant medical problems in the family as per the daughter.   PHYSICAL EXAMINATION:  VITAL SIGNS:  T-max 101.3 at the nursing home. Here the heart rate is 120, respiratory rate 34, blood pressure 150/94, saturating 92% on room air when he came in. He is currently saturating around 93% on 2 liters nasal cannula.    GENERAL: Elderly Caucasian male, mildly toxic in appearance, nonverbal at this time.   HEENT: Bilateral pupils equal. No scleral icterus. No conjunctivitis. Oral mucosa is dry. No pallor. Oral mucosa is extremely dehydrated.   NECK: He keeps his neck on the right side like a orticollis. No masses, nontender. No adenopathy. No JVD. No carotid bruit.   CHEST: Essentially breath sounds are clear but diminished. No wheezing. Normal effort. Not using accessory muscles of respiration.   HEART: Heart sounds are regular. No murmur. Peripheral pulses 2+. No lower extremity edema.   ABDOMEN: Soft, nontender. Normal bowel sounds. No hepatomegaly. No bruit. No masses.   RECTAL: Deferred.   NEUROLOGIC: He is awake. He has advanced dementia, nonverbal. He would not cooperate with any exam. He would not provide any history. He appears to have contractures of his joints. His tone is stiff.   SKIN: No cyanosis, no clubbing. Bandage applied to his left heel, as per the daughter, he has a sore.   LABORATORY, DIAGNOSTIC, AND RADIOLOGICAL DATA: White count 8.9, hemoglobin 14.4, platelet count 243,000. Basic metabolic panel: Sodium 152, potassium 3.6, BUN 43, creatinine 1.19, glucose 229, albumin 3. Troponin is negative. His last creatinine was 1.09, glucose 92 on 03/21/2011. Urinalysis is extremely pyuric?, cloudy urine, 3+ leukocyte esterase and 199 WBCs per high-power field. His chest x-ray shows left lung base developing infiltrate versus atelectasis with a trace left pleural effusion. Emphysematous lung disease present.   IMPRESSION:  Complicated urinary tract infection with dehydration, lethargy, left lower lobe pneumonia, hypernatremic dehydration, and hyperglycemia most likely secondary to infection, dysphagia secondary to dementia, advanced Alzheimer's dementia with mood disorder, hypertension, and hyperlipidemia.    PLAN:  74 year old male with advanced dementia with oropharyngeal dysphagia, also has  mood disorder. He is basically bedbound, nonverbal. He has an indwelling Foley catheter. He was being treated for pneumonia and a urinary tract infection at the skilled nursing facility with only Zyvox. Zyvox  will only cover gram-positives and will not cover gram-negatives. He has been started on vancomycin and Zosyn in the Emergency Room. I will continue that. We will also add Zithromax to the regimen to cover atypicals. I think his predominant problem is the urinary tract infection rather than pneumonia, but we will cover both.  His urine culture and blood cultures have been sent. We will give him IV hydration with D5 water because of his hypernatremia. We will hold his hydrochlorothiazide at this time. We will continue his psychotropic medications and his dementia medications with lorazepam, memantine, donepezil, risperidone, and venlafaxine. We will also get a speech consult. Right now I am going to continue his dysphagia I diet that he takes there.   The patient has a portable DO NOT RESUSCITATE from the nursing home. I discussed CODE STATUS with the daughter. She wants to maintain DO NOT RESUSCITATE. The patient has a poor prognosis with advanced dementia and dysphagia. I also told the daughter that the patient is going to have frequent UTIs because of the indwelling Foley catheter.  TIME SPENT ON ADMISSION AND  COORDINATION OF CARE:  55 minutes.    ____________________________ Fredia Sorrow, MD ag:bjt D: 06/02/2011 19:30:17 ET T: 06/03/2011 06:44:01 ET JOB#: [id number]  cc: Fredia Sorrow, MD, <Dictator> Glenetta Borg, MD Fredia Sorrow MD ELECTRONICALLY SIGNED 06/13/2011 11:07
